# Patient Record
Sex: Male | Born: 1985 | Race: Black or African American | Hispanic: No | Marital: Single | Smoking: Never smoker
Health system: Southern US, Community
[De-identification: ages and names within clinical notes are randomized; demographics above are authoritative.]

## PROBLEM LIST (undated history)

## (undated) ENCOUNTER — Emergency Department: Discharge status: Absent without leave | Payer: BLUE CROSS/BLUE SHIELD

---

## 2016-03-06 ENCOUNTER — Encounter (HOSPITAL_COMMUNITY): Payer: Self-pay | Admitting: Emergency Medicine

## 2016-03-06 ENCOUNTER — Emergency Department (HOSPITAL_COMMUNITY)
Admission: EM | Admit: 2016-03-06 | Discharge: 2016-03-06 | Disposition: A | Discharge status: Home / Self Care | Payer: BLUE CROSS/BLUE SHIELD | Attending: Emergency Medicine | Admitting: Emergency Medicine

## 2016-03-06 DIAGNOSIS — R197 Diarrhea, unspecified: Secondary | ICD-10-CM | POA: Insufficient documentation

## 2016-03-06 DIAGNOSIS — K644 Residual hemorrhoidal skin tags: Secondary | ICD-10-CM | POA: Diagnosis not present

## 2016-03-06 DIAGNOSIS — L853 Xerosis cutis: Secondary | ICD-10-CM | POA: Insufficient documentation

## 2016-03-06 LAB — COMPREHENSIVE METABOLIC PANEL
ALBUMIN: 4.6 g/dL (ref 3.5–5.0)
ALK PHOS: 45 U/L (ref 38–126)
ALT: 21 U/L (ref 17–63)
ANION GAP: 7 (ref 5–15)
AST: 21 U/L (ref 15–41)
BILIRUBIN TOTAL: 0.9 mg/dL (ref 0.3–1.2)
BUN: 7 mg/dL (ref 6–20)
CALCIUM: 9.7 mg/dL (ref 8.9–10.3)
CO2: 26 mmol/L (ref 22–32)
CREATININE: 1.09 mg/dL (ref 0.61–1.24)
Chloride: 103 mmol/L (ref 101–111)
GFR calc Af Amer: >60 mL/min (ref >60)
GFR calc non Af Amer: >60 mL/min (ref >60)
GLUCOSE: 82 mg/dL (ref 65–99)
Potassium: 3.9 mmol/L (ref 3.5–5.1)
Sodium: 136 mmol/L (ref 135–145)
TOTAL PROTEIN: 7.8 g/dL (ref 6.5–8.1)

## 2016-03-06 LAB — URINALYSIS, ROUTINE W REFLEX MICROSCOPIC
BILIRUBIN URINE: NEGATIVE
Glucose, UA: NEGATIVE mg/dL
HGB URINE DIPSTICK: NEGATIVE
KETONES UR: NEGATIVE mg/dL
Leukocytes, UA: NEGATIVE
NITRITE: NEGATIVE
Protein, ur: NEGATIVE mg/dL
Specific Gravity, Urine: 1.01 (ref 1.005–1.030)
pH: 7 (ref 5.0–8.0)

## 2016-03-06 LAB — POC OCCULT BLOOD, ED: Fecal Occult Bld: NEGATIVE

## 2016-03-06 LAB — CBC
HCT: 46.3 % (ref 39.0–52.0)
Hemoglobin: 16.7 g/dL (ref 13.0–17.0)
MCH: 31.2 pg (ref 26.0–34.0)
MCHC: 36.1 g/dL — AB (ref 30.0–36.0)
MCV: 86.4 fL (ref 78.0–100.0)
PLATELETS: 194 10*3/uL (ref 150–400)
RBC: 5.36 MIL/uL (ref 4.22–5.81)
RDW: 13 % (ref 11.5–15.5)
WBC: 5.4 10*3/uL (ref 4.0–10.5)

## 2016-03-06 NOTE — ED Triage Notes (Signed)
Pt sts diarrhea after eating and "particles in his urine" x 2 weeks

## 2016-03-06 NOTE — ED Provider Notes (Signed)
[facility]-EMERGENCY DEPT Provider Note   CSN: [id number] Arrival date & time: 03/06/16  1342     History   Chief Complaint Chief Complaint  Patient presents with  . Diarrhea    HPI Hector Bates is a 30 y.o. male.  HPI  Pt has been Acyclovir for HSV for several weeks. Has stopped due to possible side effects. Pt has been seeing grittiness in his urine. Told he had chlamydia and treated. Also tested for HIV, syphilis which were negative.   Has diarrhea for 1 month, only in the morning. At times with small amount diarrhea. Reports having hemorrhoids. No abd pain, fever, chills, myalgias, dysuria, n/v. No relieving or aggravating factors.  History reviewed. No pertinent past medical history.  There are no active problems to display for this patient.   History reviewed. No pertinent surgical history.     Home Medications    Prior to Admission medications   Not on File    Family History History reviewed. No pertinent family history.  Social History Social History  Substance Use Topics  . Smoking status: Never Smoker  . Smokeless tobacco: Never Used  . Alcohol use Yes     Comment: occ     Allergies   Patient has no known allergies.   Review of Systems Review of Systems  Constitutional: Negative for chills and fever.  HENT: Negative for ear pain and sore throat.   Eyes: Negative for pain and visual disturbance.  Respiratory: Negative for cough and shortness of breath.   Cardiovascular: Negative for chest pain and palpitations.  Gastrointestinal: Positive for blood in stool and diarrhea. Negative for abdominal pain, nausea and vomiting.  Genitourinary: Negative for dysuria and hematuria.  Musculoskeletal: Negative for arthralgias and back pain.  Skin: Negative for color change and rash.  Neurological: Negative for seizures and syncope.  All other systems reviewed and are negative.    Physical Exam Updated Vital Signs BP 140/89 (BP Location: Left Arm)    Pulse 88   Temp 98.2 F (36.8 C) (Oral)   Resp 18   Ht 6\' 3"  (1.905 m)   Wt 193 lb 5 oz (87.7 kg)   SpO2 99%   BMI 24.16 kg/m   Physical Exam  Constitutional: He is oriented to person, place, and time. He appears well-developed and well-nourished. No distress.  HENT:  Head: Normocephalic and atraumatic.  Nose: Nose normal.  Eyes: Conjunctivae and EOM are normal. Pupils are equal, round, and reactive to light. Right eye exhibits no discharge. Left eye exhibits no discharge. No scleral icterus.  Neck: Normal range of motion. Neck supple.  Cardiovascular: Normal rate and regular rhythm.  Exam reveals no gallop and no friction rub.   No murmur heard. Pulmonary/Chest: Effort normal and breath sounds normal. No stridor. No respiratory distress. He has no rales.  Abdominal: Soft. He exhibits no distension. There is no tenderness. There is no rigidity, no rebound, no guarding and no CVA tenderness.  Genitourinary: Prostate normal. Rectal exam shows external hemorrhoid.  Genitourinary Comments: Brown stool  Musculoskeletal: He exhibits no edema or tenderness.  Neurological: He is alert and oriented to person, place, and time.  Skin: Skin is warm and dry. No rash noted. He is not diaphoretic. No erythema.  Patches of dry skin.  Psychiatric: He has a normal mood and affect.  Vitals reviewed.    ED Treatments / Results  Labs (all labs ordered are listed, but only abnormal results are displayed) Labs Reviewed  CBC -  Abnormal; Notable for the following:       Result Value   MCHC 36.1 (*)    All other components within normal limits  COMPREHENSIVE METABOLIC PANEL  URINALYSIS, ROUTINE W REFLEX MICROSCOPIC ([facility])    EKG  EKG Interpretation None       Radiology No results found.  Procedures Procedures (including critical care time)  Medications Ordered in ED Medications - No data to display   Initial Impression / Assessment and Plan / ED Course  I have reviewed  the triage vital signs and the nursing notes.  Pertinent labs & imaging results that were available during my care of the patient were reviewed by me and considered in my medical decision making (see chart for details).  Clinical Course     Urine without evidence of infection. Patient is currently being treated for chlamydia and instructed to continue management. Labs grossly reassuring. Abdomen benign. Patient with external hemorrhoids, which are likely the cause of his rectal bleeding. Diarrhea only associated in the morning and otherwise has regular bowel movements. Low suspicion for inflammatory bowel disease. Symptoms do not sound infectious in nature.  Safe for discharge with strict return precautions. Patient instructed to follow-up with his primary care provider for continued workup and management.  Final Clinical Impressions(s) / ED Diagnoses   Final diagnoses:  Diarrhea, unspecified type  External hemorrhoids  Dry skin   Disposition: Discharge  Condition: Good  I have discussed the results, Dx and Tx plan with the patient who expressed understanding and agree(s) with the plan. Discharge instructions discussed at great length. The patient was given strict return precautions who verbalized understanding of the instructions. No further questions at time of discharge.    New Prescriptions   No medications on file    Follow Up: primary care provider  Schedule an appointment as soon as possible for a visit  As needed      Nira ConnPedro Eduardo Cardama, MD 03/06/16 1738

## 2016-03-06 NOTE — ED Notes (Signed)
Pt states he understands instruc tions and all questions answered. Home stable with steady gait.

## 2016-03-10 ENCOUNTER — Other Ambulatory Visit: Payer: Self-pay | Admitting: Nurse Practitioner

## 2016-03-10 DIAGNOSIS — N5082 Scrotal pain: Secondary | ICD-10-CM

## 2016-03-13 ENCOUNTER — Other Ambulatory Visit: Payer: Self-pay | Admitting: Nurse Practitioner

## 2016-03-13 DIAGNOSIS — N5082 Scrotal pain: Secondary | ICD-10-CM

## 2016-03-14 ENCOUNTER — Other Ambulatory Visit: Payer: BLUE CROSS/BLUE SHIELD

## 2016-03-14 ENCOUNTER — Ambulatory Visit
Admission: RE | Admit: 2016-03-14 | Discharge: 2016-03-14 | Disposition: A | Discharge status: Home / Self Care | Payer: BLUE CROSS/BLUE SHIELD | Source: Ambulatory Visit | Attending: Nurse Practitioner | Admitting: Nurse Practitioner

## 2016-03-14 ENCOUNTER — Other Ambulatory Visit: Payer: Self-pay | Admitting: Nurse Practitioner

## 2016-03-14 DIAGNOSIS — N5082 Scrotal pain: Secondary | ICD-10-CM

## 2017-09-18 IMAGING — US US ART/VEN ABD/PELV/SCROTUM DOPPLER LTD
1 series · 13 of 25 positions shown · non-contrast
Comparison: None.

CLINICAL DATA: Patient noticed prominent pain in scrotum 3 weeks
ago. No significant pain.

EXAM:
SCROTAL ULTRASOUND
DOPPLER ULTRASOUND OF THE TESTICLES
TECHNIQUE: Complete ultrasound examination of the testicles, epididymis, and
other scrotal structures was performed. Color and spectral Doppler
ultrasound were also utilized to evaluate blood flow to the
testicles.

[Series 1: us art/ven abd/pelv/scrotum doppler ltd · 0.07mm/px · 13 of 71 slices shown]
[im 1/71]
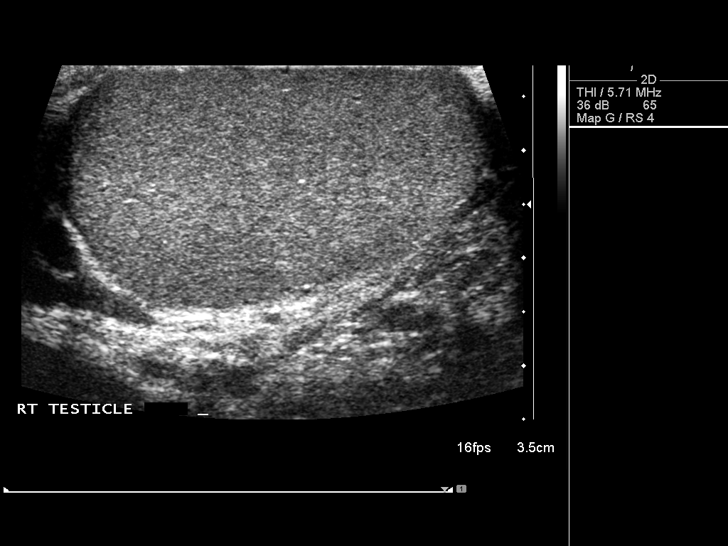
[im 6/71]
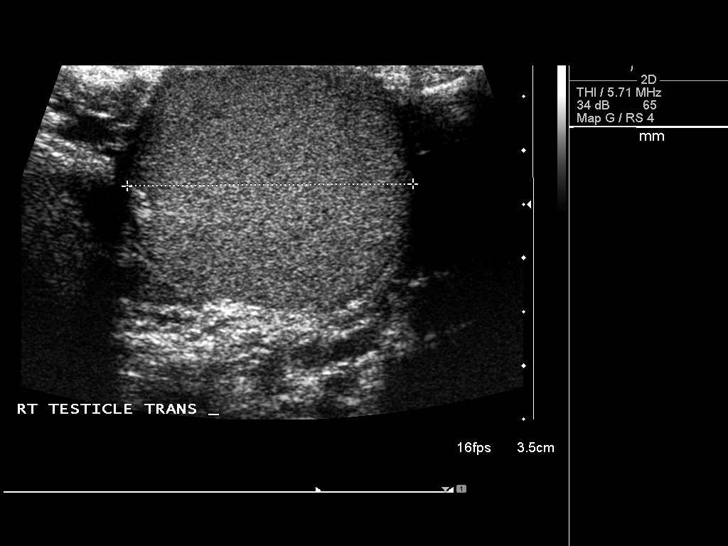
[im 12/71]
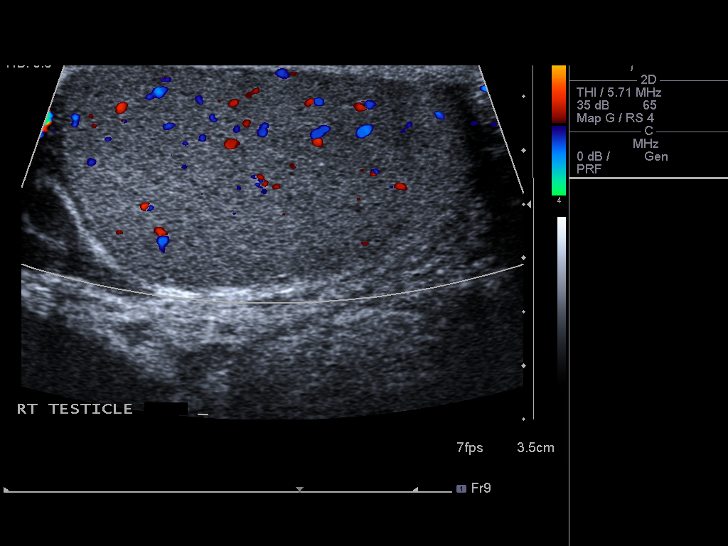
[im 18/71]
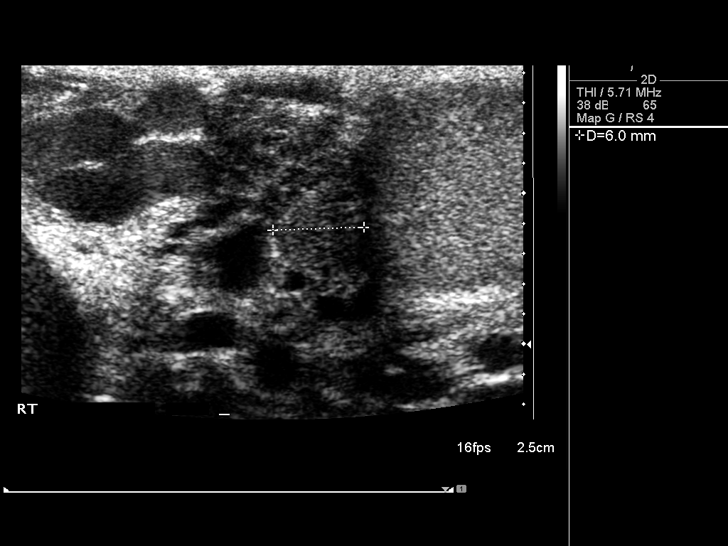
[im 24/71]
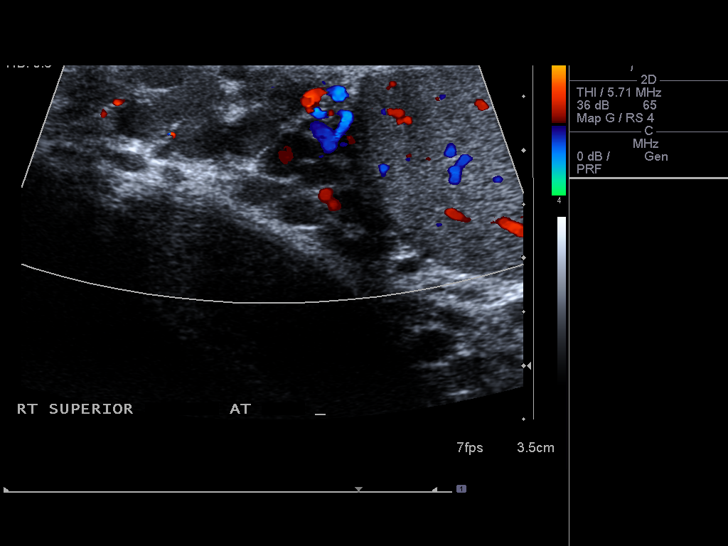
[im 30/71]
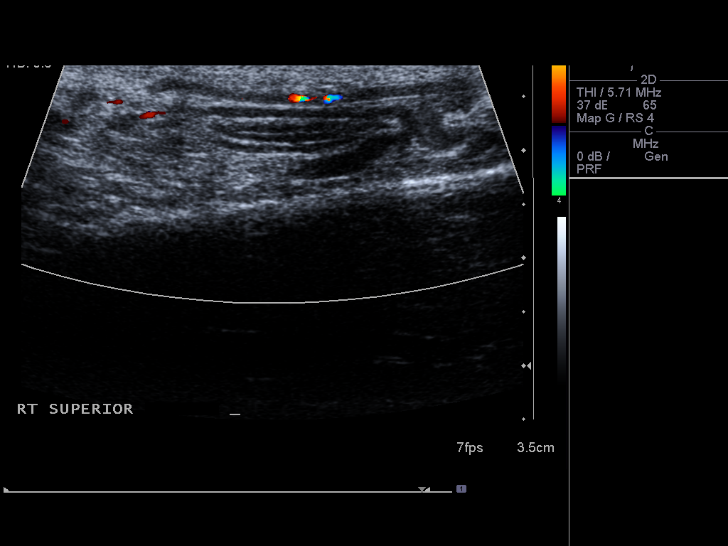
[im 36/71]
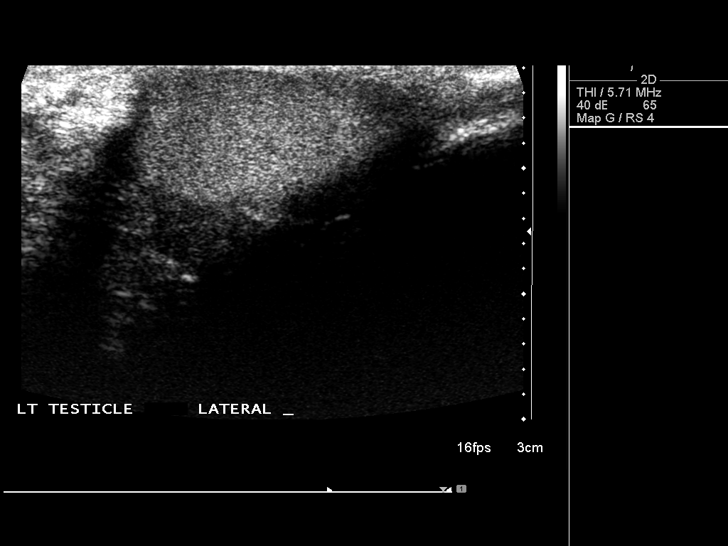
[im 41/71]
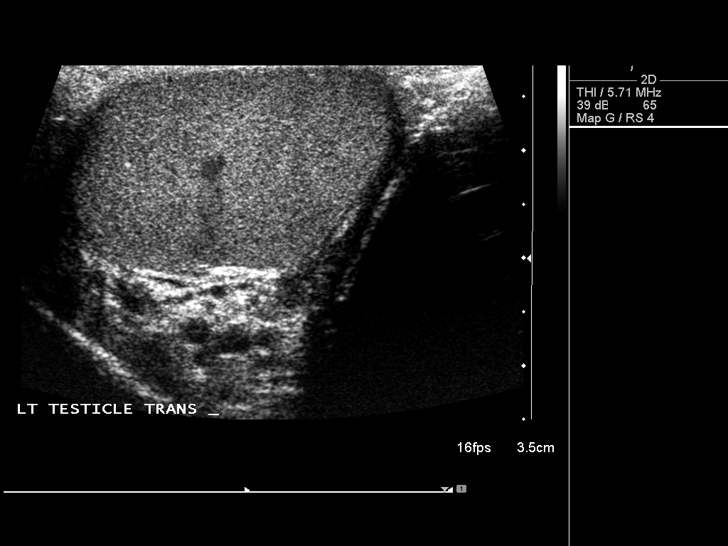
[im 47/71]
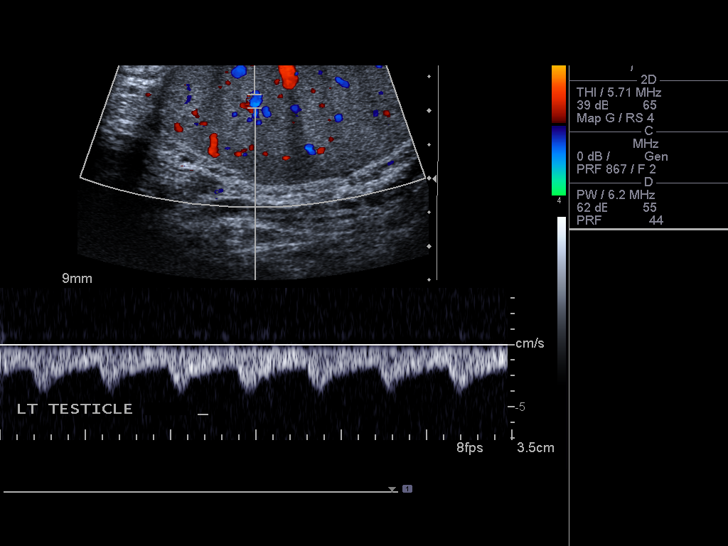
[im 53/71]
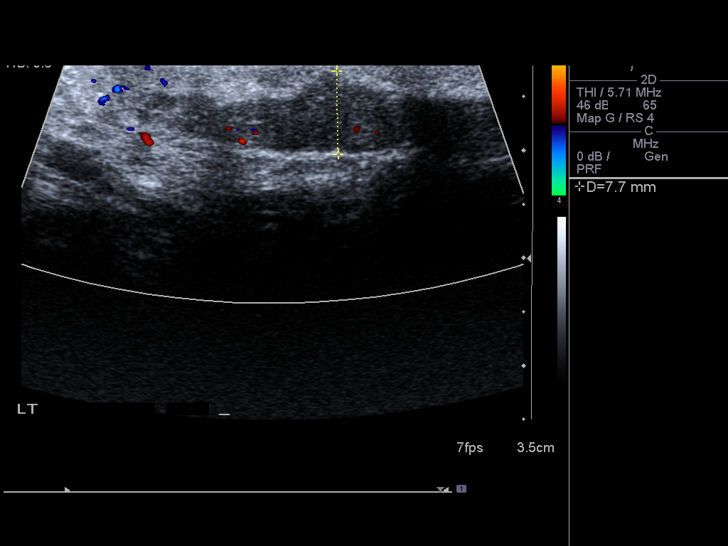
[im 59/71]
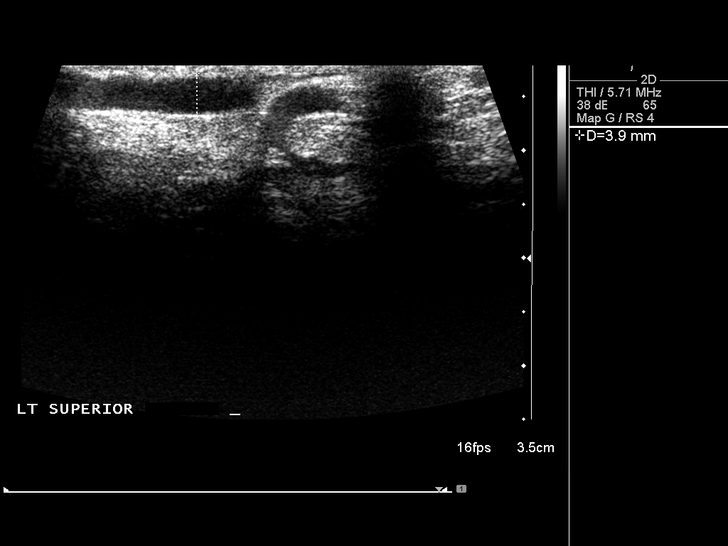
[im 65/71]
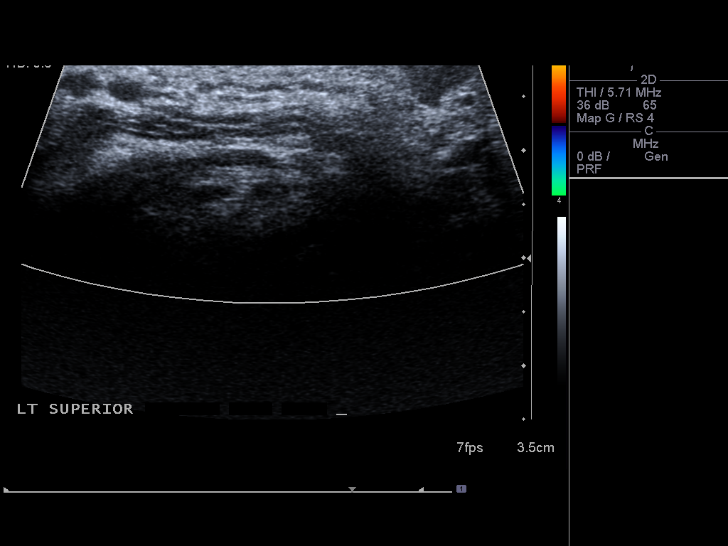
[im 71/71]
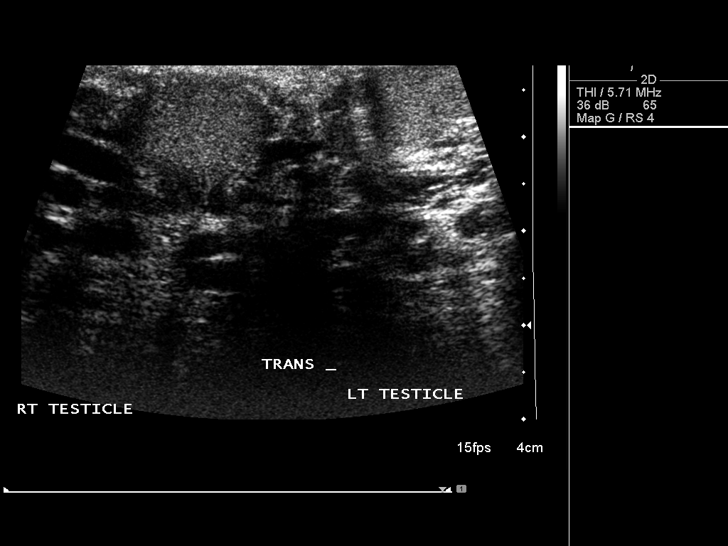

[13 of 25 positions shown; findings below may reference images not displayed]

FINDINGS: Right testicle

Measurements: 4.1 x 2.3 x 2.7 cm, within normal limits. Scattered
microcalcifications are present. There is no focal mass lesion.

Left testicle

Measurements: 4.2 x 1.9 x 2.9 cm, within normal limits. Scattered
microcalcifications are present. There is no focal mass lesion.

Right epididymis:  Normal in size and appearance.

Left epididymis:  Normal in size and appearance.

Hydrocele:  None visualized.

Varicocele: Bilateral varicoceles are noted. A right-sided
varicocele measures up to 3.7 mm. A left-sided varicocele measures
up to 3.9 mm.

Pulsed Doppler interrogation of both testes demonstrates normal low
resistance arterial and venous waveforms bilaterally.
IMPRESSION: 1. Bilateral varicoceles account for the palpable lesions.
2. Bilateral microlithiasis. Current literature suggests that
testicular microlithiasis is not a significant independent risk
factor for development of testicular carcinoma, and that follow up
imaging is not warranted in the absence of other risk factors.
Monthly testicular self-examination and annual physical exams are
considered appropriate surveillance. If patient has other risk
factors for testicular carcinoma, then referral to Urology should be
considered. (Reference: Tiyisela, et al.: A 5-Year Follow up Study
of Asymptomatic Men with Testicular Microlithiasis. J Urol 7664;
